# Patient Record
Sex: Male | Born: 1990 | Race: White | Hispanic: No | Marital: Married | Smoking: Former smoker
Health system: Southern US, Community
[De-identification: ages and names within clinical notes are randomized; demographics above are authoritative.]

## PROBLEM LIST (undated history)

## (undated) HISTORY — PX: LEG SURGERY: SHX1003

## (undated) HISTORY — PX: OTHER SURGICAL HISTORY: SHX169

## (undated) HISTORY — PX: NOSE SURGERY: SHX723

---

## 2012-08-26 ENCOUNTER — Ambulatory Visit (INDEPENDENT_AMBULATORY_CARE_PROVIDER_SITE_OTHER): Payer: BC Managed Care – PPO | Admitting: Physician Assistant

## 2012-08-26 VITALS — BP 112/82 | HR 64 | Temp 98.2°F | Resp 18 | Ht 72.5 in | Wt 267.0 lb

## 2012-08-26 DIAGNOSIS — J029 Acute pharyngitis, unspecified: Secondary | ICD-10-CM

## 2012-08-26 DIAGNOSIS — Z2089 Contact with and (suspected) exposure to other communicable diseases: Secondary | ICD-10-CM

## 2012-08-26 DIAGNOSIS — Z20818 Contact with and (suspected) exposure to other bacterial communicable diseases: Secondary | ICD-10-CM
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
## 2012-08-28 LAB — CULTURE, GROUP A STREP: Organism ID, Bacteria: NORMAL

## 2012-09-28 ENCOUNTER — Emergency Department (HOSPITAL_COMMUNITY)
Admission: EM | Admit: 2012-09-28 | Discharge: 2012-09-28 | Disposition: A | Discharge status: Home / Self Care | Payer: BC Managed Care – PPO | Attending: Emergency Medicine | Admitting: Emergency Medicine

## 2012-09-28 ENCOUNTER — Encounter (HOSPITAL_COMMUNITY): Payer: Self-pay | Admitting: Unknown Physician Specialty

## 2012-09-28 ENCOUNTER — Emergency Department (HOSPITAL_COMMUNITY): Payer: BC Managed Care – PPO

## 2012-09-28 DIAGNOSIS — R110 Nausea: Secondary | ICD-10-CM | POA: Insufficient documentation

## 2012-09-28 DIAGNOSIS — S71009A Unspecified open wound, unspecified hip, initial encounter: Secondary | ICD-10-CM | POA: Insufficient documentation

## 2012-09-28 DIAGNOSIS — R0789 Other chest pain: Secondary | ICD-10-CM | POA: Insufficient documentation

## 2012-09-28 DIAGNOSIS — Z23 Encounter for immunization: Secondary | ICD-10-CM | POA: Insufficient documentation

## 2012-09-28 DIAGNOSIS — F17200 Nicotine dependence, unspecified, uncomplicated: Secondary | ICD-10-CM | POA: Insufficient documentation

## 2012-09-28 DIAGNOSIS — Y998 Other external cause status: Secondary | ICD-10-CM | POA: Insufficient documentation

## 2012-09-28 DIAGNOSIS — T148XXA Other injury of unspecified body region, initial encounter: Secondary | ICD-10-CM

## 2012-09-28 DIAGNOSIS — Y929 Unspecified place or not applicable: Secondary | ICD-10-CM | POA: Insufficient documentation

## 2012-09-28 DIAGNOSIS — S71109A Unspecified open wound, unspecified thigh, initial encounter: Secondary | ICD-10-CM | POA: Insufficient documentation

## 2012-09-28 DIAGNOSIS — W39XXXA Discharge of firework, initial encounter: Secondary | ICD-10-CM | POA: Insufficient documentation

## 2012-09-28 MED ORDER — FENTANYL CITRATE 0.05 MG/ML IJ SOLN
50.0000 ug | Freq: Once | INTRAMUSCULAR | Status: AC
  Administered 2012-09-28: 50 ug via INTRAVENOUS
  Filled 2012-09-28: qty 2

## 2012-09-28 MED ORDER — HYDROCODONE-ACETAMINOPHEN 5-325 MG PO TABS: 1.0000 | ORAL_TABLET | Freq: Four times a day (QID) | ORAL | Status: DC | PRN

## 2012-09-28 MED ORDER — ACETAMINOPHEN 500 MG PO TABS
1000.0000 mg | ORAL_TABLET | Freq: Once | ORAL | Status: AC
  Administered 2012-09-28: 1000 mg via ORAL
  Filled 2012-09-28: qty 2

## 2012-09-28 MED ORDER — HYDROMORPHONE HCL PF 1 MG/ML IJ SOLN
1.0000 mg | Freq: Once | INTRAMUSCULAR | Status: AC
  Administered 2012-09-28: 1 mg via INTRAVENOUS
  Filled 2012-09-28: qty 1

## 2012-09-28 MED ORDER — FENTANYL CITRATE 0.05 MG/ML IJ SOLN
50.0000 ug | Freq: Once | INTRAMUSCULAR | Status: AC
  Administered 2012-09-28: 50 ug via INTRAVENOUS
  Filled 2012-09-28 (×2): qty 2

## 2012-09-28 MED ORDER — TETANUS-DIPHTHERIA TOXOIDS TD 5-2 LFU IM INJ
0.5000 mL | INJECTION | Freq: Once | INTRAMUSCULAR | Status: AC
  Administered 2012-09-28: 0.5 mL via INTRAMUSCULAR
  Filled 2012-09-28: qty 0.5

## 2012-09-28 MED ORDER — NALOXONE HCL 0.4 MG/ML IJ SOLN
INTRAMUSCULAR | Status: AC
  Filled 2012-09-28: qty 1
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
## 2012-09-29 ENCOUNTER — Telehealth (HOSPITAL_COMMUNITY): Payer: Self-pay | Admitting: Emergency Medicine

## 2012-09-29 NOTE — Telephone Encounter (Signed)
Left message

## 2012-09-30 ENCOUNTER — Telehealth (INDEPENDENT_AMBULATORY_CARE_PROVIDER_SITE_OTHER): Payer: Self-pay | Admitting: Orthopedic Surgery

## 2012-09-30 ENCOUNTER — Telehealth (HOSPITAL_COMMUNITY): Payer: Self-pay | Admitting: Emergency Medicine
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
## 2012-09-30 NOTE — Telephone Encounter (Signed)
Left message at both numbers
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
## 2022-07-18 ENCOUNTER — Ambulatory Visit: Payer: Self-pay | Admitting: Family Medicine

## 2022-07-18 NOTE — Progress Notes (Deleted)
  [facility] [address] [place] [address] Phone: [phone] Subjective:    I'm seeing this patient by the request  of:  Lesli Albee, MD  CC:   [id number]  Glenn Stanton is a 32 y.o. male coming in with complaint of ***  Xray L knee 07/17/2022 1.  No acute fracture.  2.  No malalignment.  3.  Small knee joint effusion.  4.  Remote healed proximal fibular fracture.  5.  Redemonstrated partially imaged left tibial intramedullary nail with similar fracture of the inferior proximal interlocking screw. No new complications along the imaged portions  6.  Joint spaces appear maintained.   Xray R foot 07/17/2022 1.  Best seen on the oblique view, there may be an acute nondisplaced fracture through the lateral aspect of the fifth metatarsal head. Correlate with tenderness in this location. Mild overlying soft tissue swelling.  2.  Otherwise, no acute fractures or traumatic malalignment.  3.  Mild first TMT and first MTP joint degenerative changes.  4.  Mild soft tissue swelling overlying the dorsal forefoot.     No past medical history on file. No past surgical history on file. Social History   Socioeconomic History   Marital status: Single    Spouse name: Not on file   Number of children: Not on file   Years of education: Not on file   Highest education level: Not on file  Occupational History   Not on file  Tobacco Use   Smoking status: Some Days   Smokeless tobacco: Not on file  Substance and Sexual Activity   Alcohol use: No   Drug use: No   Sexual activity: Not on file  Other Topics Concern   Not on file  Social History Narrative   Not on file   Social Determinants of Health   Financial Resource Strain: Not on file  Food Insecurity: Not on file  Transportation Needs: Not on file  Physical Activity: Not on file  Stress: Not on file  Social Connections: Not on file   No Known Allergies No family history on  file.     Current Outpatient Medications (Analgesics):    HYDROcodone-acetaminophen (NORCO/VICODIN) 5-325 MG per tablet, Take 1 tablet by mouth every 6 (six) hours as needed for pain.     Reviewed prior external information including notes and imaging from  primary care provider As well as notes that were available from care everywhere and other healthcare systems.  Past medical history, social, surgical and family history all reviewed in electronic medical record.  No pertanent information unless stated regarding to the chief complaint.   Review of Systems:  No headache, visual changes, nausea, vomiting, diarrhea, constipation, dizziness, abdominal pain, skin rash, fevers, chills, night sweats, weight loss, swollen lymph nodes, body aches, joint swelling, chest pain, shortness of breath, mood changes. POSITIVE muscle aches  Objective  There were no vitals taken for this visit.   General: No apparent distress alert and oriented x3 mood and affect normal, dressed appropriately.  HEENT: Pupils equal, extraocular movements intact  Respiratory: Patient's speak in full sentences and does not appear short of breath  Cardiovascular: No lower extremity edema, non tender, no erythema      Impression and Recommendations:

## 2023-11-07 ENCOUNTER — Ambulatory Visit
Admission: EM | Admit: 2023-11-07 | Discharge: 2023-11-07 | Disposition: A | Discharge status: Home / Self Care | Attending: Family Medicine | Admitting: Family Medicine

## 2023-11-07 DIAGNOSIS — L255 Unspecified contact dermatitis due to plants, except food: Secondary | ICD-10-CM

## 2023-11-07 MED ORDER — PREDNISONE 10 MG (21) PO TBPK
ORAL_TABLET | Freq: Every day | ORAL | 0 refills | Status: DC
Start: 2023-11-07 — End: 2023-12-04

## 2023-11-07 MED ORDER — TRIAMCINOLONE ACETONIDE 0.1 % EX CREA: 1.0000 | TOPICAL_CREAM | Freq: Two times a day (BID) | CUTANEOUS | 0 refills | Status: DC

## 2023-11-07 NOTE — ED Triage Notes (Signed)
 Pt present with poison ivy exposure. Pt has blisters on the rt forearm and rt leg.
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
## 2023-11-07 NOTE — Discharge Instructions (Addendum)
 Start prednisone  taper as prescribed.  You may use the topical steroid cream twice daily as needed to affected areas.  You may do over-the-counter allergy medicine such as Claritin or Zyrtec daily to help with itching.  Follow-up with your PCP if your symptoms do not improve.  Please go to the ER for any worsening symptoms.  Hope you feel better soon!

## 2023-12-04 ENCOUNTER — Ambulatory Visit
Admission: EM | Admit: 2023-12-04 | Discharge: 2023-12-04 | Disposition: A | Discharge status: Home / Self Care | Attending: Family Medicine | Admitting: Family Medicine

## 2023-12-04 ENCOUNTER — Other Ambulatory Visit: Payer: Self-pay

## 2023-12-04 DIAGNOSIS — L255 Unspecified contact dermatitis due to plants, except food: Secondary | ICD-10-CM | POA: Diagnosis not present

## 2023-12-04 MED ORDER — PREDNISONE 10 MG (21) PO TBPK: ORAL_TABLET | Freq: Every day | ORAL | 0 refills | Status: DC

## 2023-12-04 MED ORDER — DEXAMETHASONE SODIUM PHOSPHATE 10 MG/ML IJ SOLN
10.0000 mg | Freq: Once | INTRAMUSCULAR | Status: AC
  Administered 2023-12-04: 10 mg via INTRAMUSCULAR

## 2023-12-04 NOTE — ED Provider Notes (Signed)
 [facility]    CSN: [id number] Arrival date & time: 12/04/23  0946      History   Chief Complaint No chief complaint on file.   HPI Glenn Stanton is a 33 y.o. male presents for rash.  Patient reports 5 days of a pruritic vesicular rash on his right hand/arm, right knee, left calf.  He hunts and states he is constantly exposed to poison ivy or oak.  He was seen last month for the same thing that resolved after prednisone .  He states he had a couple doses left of the prednisone  which he started and states it cleared up the rash that was on his face.  No fevers or chills.  No history of MRSA.  No other concerns at this time.  HPI  History reviewed. No pertinent past medical history.  Patient Active Problem List   Diagnosis Date Noted   Blast injury 09/28/2012    Past Surgical History:  Procedure Laterality Date   arm surgery Left    LEG SURGERY Left    NOSE SURGERY         Home Medications    Prior to Admission medications   Medication Sig Start Date End Date Taking? Authorizing Provider  predniSONE  (STERAPRED UNI-PAK 21 TAB) 10 MG (21) TBPK tablet Take by mouth daily. Take 6 tabs by mouth daily  for 1 day, then 5 tabs for 1 day, then 4 tabs for 1 day, then 3 tabs for 1 day, 2 tabs for 1 day, then 1 tab by mouth daily for 1 days 12/05/23  Yes Eber Ferrufino, Jodi R, NP  HYDROcodone -acetaminophen  (NORCO/VICODIN) 5-325 MG per tablet Take 1 tablet by mouth every 6 (six) hours as needed for pain. 09/28/12   Lozier, Stephen, MD  triamcinolone  cream (KENALOG ) 0.1 % Apply 1 Application topically 2 (two) times daily. 11/07/23   Loreda Myla SAUNDERS, NP    Family History History reviewed. No pertinent family history.  Social History Social History   Tobacco Use   Smoking status: Former    Types: Cigarettes  Substance Use Topics   Alcohol use: Yes    Comment: occ   Drug use: No     Allergies   Oxycodone   Review of Systems Review of Systems  Skin:  Positive for rash.      Physical Exam Triage Vital Signs ED Triage Vitals  Encounter Vitals Group     BP 12/04/23 1008 (!) 142/95     Girls Systolic BP Percentile --      Girls Diastolic BP Percentile --      Boys Systolic BP Percentile --      Boys Diastolic BP Percentile --      Pulse Rate 12/04/23 1008 83     Resp 12/04/23 1008 17     Temp 12/04/23 1008 97.9 F (36.6 C)     Temp Source 12/04/23 1008 Oral     SpO2 12/04/23 1008 94 %     Weight --      Height --      Head Circumference --      Peak Flow --      Pain Score 12/04/23 1005 0     Pain Loc --      Pain Education --      Exclude from Growth Chart --    No data found.  Updated Vital Signs BP (!) 142/95   Pulse 83   Temp 97.9 F (36.6 C) (Oral)   Resp 17  SpO2 94%   Visual Acuity Right Eye Distance:   Left Eye Distance:   Bilateral Distance:    Right Eye Near:   Left Eye Near:    Bilateral Near:     Physical Exam Vitals and nursing note reviewed.  Constitutional:      General: He is not in acute distress.    Appearance: Normal appearance. He is not ill-appearing.  HENT:     Head: Normocephalic and atraumatic.  Eyes:     Pupils: Pupils are equal, round, and reactive to light.  Cardiovascular:     Rate and Rhythm: Normal rate.  Pulmonary:     Effort: Pulmonary effort is normal.  Skin:    General: Skin is warm and dry.     Comments: Scattered vesicular rash on right hand/forearm, left calf, right knee.  No rashes on face.  Neurological:     General: No focal deficit present.     Mental Status: He is alert and oriented to person, place, and time.  Psychiatric:        Mood and Affect: Mood normal.        Behavior: Behavior normal.      UC Treatments / Results  Labs (all labs ordered are listed, but only abnormal results are displayed) Labs Reviewed - No data to display  EKG   Radiology No results found.  Procedures Procedures (including critical care time)  Medications Ordered in UC Medications   dexamethasone  (DECADRON ) injection 10 mg (has no administration in time range)    Initial Impression / Assessment and Plan / UC Course  I have reviewed the triage vital signs and the nursing notes.  Pertinent labs & imaging results that were available during my care of the patient were reviewed by me and considered in my medical decision making (see chart for details).     Patient requested steroid injection, dexamethasone  IM given in clinic.  Start steroid taper tomorrow.  He already has triamcinolone  cream at home to use topically as needed.  PCP follow-up if symptoms do not improve.  ER precautions reviewed Final Clinical Impressions(s) / UC Diagnoses   Final diagnoses:  Rhus dermatitis     Discharge Instructions      You are given a steroid injection in the clinic.  Start the prednisone  taper tomorrow, 9/11.  You may continue the previously prescribed triamcinolone  topical steroid cream as needed to the areas for itching.  You may also take over-the-counter Zyrtec or Claritin daily.  Follow-up with your PCP if your symptoms do not improve.  ER precautions reviewed     ED Prescriptions     Medication Sig Dispense Auth. Provider   predniSONE  (STERAPRED UNI-PAK 21 TAB) 10 MG (21) TBPK tablet Take by mouth daily. Take 6 tabs by mouth daily  for 1 day, then 5 tabs for 1 day, then 4 tabs for 1 day, then 3 tabs for 1 day, 2 tabs for 1 day, then 1 tab by mouth daily for 1 days 21 tablet Arlean Thies, Jodi R, NP      PDMP not reviewed this encounter.   Loreda Myla SAUNDERS, NP 12/04/23 1030

## 2023-12-04 NOTE — ED Triage Notes (Signed)
 Pt c/o poison ivy on right hand, right arm, right knee, left calf, right upper eye lid and left earx5d. Pt states was in the woods hunting and that's how he got it

## 2023-12-04 NOTE — Discharge Instructions (Addendum)
 You are given a steroid injection in the clinic.  Start the prednisone  taper tomorrow, 9/11.  You may continue the previously prescribed triamcinolone  topical steroid cream as needed to the areas for itching.  You may also take over-the-counter Zyrtec or Claritin daily.  Follow-up with your PCP if your symptoms do not improve.  ER precautions reviewed

## 2024-03-06 ENCOUNTER — Other Ambulatory Visit: Payer: Self-pay

## 2024-03-06 ENCOUNTER — Ambulatory Visit
Admission: EM | Admit: 2024-03-06 | Discharge: 2024-03-06 | Disposition: A | Discharge status: Home / Self Care | Source: Home / Self Care

## 2024-03-06 DIAGNOSIS — J209 Acute bronchitis, unspecified: Secondary | ICD-10-CM | POA: Diagnosis not present

## 2024-03-06 MED ORDER — BENZONATATE 200 MG PO CAPS: 200.0000 mg | ORAL_CAPSULE | Freq: Three times a day (TID) | ORAL | 0 refills | Status: AC | PRN

## 2024-03-06 MED ORDER — AZITHROMYCIN 250 MG PO TABS: 250.0000 mg | ORAL_TABLET | Freq: Every day | ORAL | 0 refills | Status: AC

## 2024-03-06 MED ORDER — PREDNISONE 20 MG PO TABS: 40.0000 mg | ORAL_TABLET | Freq: Every day | ORAL | 0 refills | Status: AC

## 2024-03-06 NOTE — ED Triage Notes (Signed)
 Pt c/o losing voice, ore throat, productive cough w/green mucous, chest congestionx5d.

## 2024-03-06 NOTE — ED Provider Notes (Signed)
 [facility]    CSN: [id number] Arrival date & time: 03/06/24  9156      History   Chief Complaint Chief Complaint  Patient presents with   Cough   Sore Throat    HPI Glenn Stanton is a 33 y.o. male  presents for evaluation of URI symptoms for 5 days. Patient reports associated symptoms of cough, congestion, sore throat, laryngitis. Denies N/V/D, fevers, ear pain, body aches, shortness of breath. Patient does not have a hx of asthma. Patient is not an active smoker.   Reports no known sick contacts.  Pt has taken DayQuil NyQuil OTC for symptoms. Pt has no other concerns at this time.    Cough Associated symptoms: sore throat   Sore Throat    History reviewed. No pertinent past medical history.  Patient Active Problem List   Diagnosis Date Noted   Blast injury 09/28/2012    Past Surgical History:  Procedure Laterality Date   arm surgery Left    LEG SURGERY Left    NOSE SURGERY         Home Medications    Prior to Admission medications  Medication Sig Start Date End Date Taking? Authorizing Provider  azithromycin (ZITHROMAX) 250 MG tablet Take 1 tablet (250 mg total) by mouth daily. Take first 2 tablets together, then 1 every day until finished. 03/09/24  Yes Danity Schmelzer, Jodi R, NP  benzonatate (TESSALON) 200 MG capsule Take 1 capsule (200 mg total) by mouth 3 (three) times daily as needed. 03/06/24  Yes Kemaya Dorner, Jodi R, NP  predniSONE  (DELTASONE ) 20 MG tablet Take 2 tablets (40 mg total) by mouth daily with breakfast for 5 days. 03/06/24 03/11/24 Yes Loreda Myla SAUNDERS, NP    Family History History reviewed. No pertinent family history.  Social History Social History[1]   Allergies   Oxycodone   Review of Systems Review of Systems  HENT:  Positive for congestion and sore throat.   Respiratory:  Positive for cough.      Physical Exam Triage Vital Signs ED Triage Vitals  Encounter Vitals Group     BP 03/06/24 0855 (!) 148/93     Girls Systolic BP  Percentile --      Girls Diastolic BP Percentile --      Boys Systolic BP Percentile --      Boys Diastolic BP Percentile --      Pulse Rate 03/06/24 0855 85     Resp 03/06/24 0855 20     Temp 03/06/24 0855 97.8 F (36.6 C)     Temp Source 03/06/24 0855 Oral     SpO2 03/06/24 0855 98 %     Weight --      Height --      Head Circumference --      Peak Flow --      Pain Score 03/06/24 0853 3     Pain Loc --      Pain Education --      Exclude from Growth Chart --    No data found.  Updated Vital Signs BP (!) 148/93   Pulse 85   Temp 97.8 F (36.6 C) (Oral)   Resp 20   SpO2 98%   Visual Acuity Right Eye Distance:   Left Eye Distance:   Bilateral Distance:    Right Eye Near:   Left Eye Near:    Bilateral Near:     Physical Exam Vitals and nursing note reviewed.  Constitutional:  General: He is not in acute distress.    Appearance: Normal appearance. He is not ill-appearing or toxic-appearing.  HENT:     Head: Normocephalic and atraumatic.     Right Ear: Tympanic membrane and ear canal normal.     Left Ear: Tympanic membrane and ear canal normal.     Nose: Congestion present.     Mouth/Throat:     Mouth: Mucous membranes are moist.     Pharynx: Posterior oropharyngeal erythema present.  Eyes:     Pupils: Pupils are equal, round, and reactive to light.  Cardiovascular:     Rate and Rhythm: Normal rate and regular rhythm.     Heart sounds: Normal heart sounds.  Pulmonary:     Effort: Pulmonary effort is normal.     Breath sounds: Normal breath sounds. No wheezing, rhonchi or rales.  Musculoskeletal:     Cervical back: Normal range of motion and neck supple.  Lymphadenopathy:     Cervical: No cervical adenopathy.  Skin:    General: Skin is warm and dry.  Neurological:     General: No focal deficit present.     Mental Status: He is alert and oriented to person, place, and time.  Psychiatric:        Mood and Affect: Mood normal.        Behavior:  Behavior normal.      UC Treatments / Results  Labs (all labs ordered are listed, but only abnormal results are displayed) Labs Reviewed - No data to display  EKG   Radiology No results found.  Procedures Procedures (including critical care time)  Medications Ordered in UC Medications - No data to display  Initial Impression / Assessment and Plan / UC Course  I have reviewed the triage vital signs and the nursing notes.  Pertinent labs & imaging results that were available during my care of the patient were reviewed by me and considered in my medical decision making (see chart for details).     Reviewed exam and symptoms with patient.  No red flags.  He declined COVID testing.  Discussed viral bronchitis and symptomatic treatment.  Will do Tessalon and prednisone .  Provisional prescription for azithromycin provided with instruction not to take unless symptoms do not improve or worsen over the next 3 days and patient verbalized understanding.  PCP follow-up if symptoms do not improve.  ER precautions reviewed. Final Clinical Impressions(s) / UC Diagnoses   Final diagnoses:  Acute bronchitis, unspecified organism     Discharge Instructions      Start Tessalon as needed for your cough 3 times a day.  You may take prednisone  daily for 5 days.  Provisional prescription for an antibiotic azithromycin has been provided.  Please do not take unless your symptoms do not improve or worsen over the next 3 days.  Lots of rest and fluids.  Please follow-up with your PCP if your symptoms do not improve.  Please go to the emergency room for any worsening symptoms.  I hope you feel better soon!    ED Prescriptions     Medication Sig Dispense Auth. Provider   benzonatate (TESSALON) 200 MG capsule Take 1 capsule (200 mg total) by mouth 3 (three) times daily as needed. 20 capsule Jarrad Mclees, Jodi R, NP   azithromycin (ZITHROMAX) 250 MG tablet Take 1 tablet (250 mg total) by mouth daily. Take  first 2 tablets together, then 1 every day until finished. 6 tablet Valente Fosberg, Jodi R, NP   predniSONE  (  DELTASONE ) 20 MG tablet Take 2 tablets (40 mg total) by mouth daily with breakfast for 5 days. 10 tablet Alvena Kiernan, Jodi R, NP      PDMP not reviewed this encounter.    [1]  Social History Tobacco Use   Smoking status: Former    Types: Cigarettes  Vaping Use   Vaping status: Never Used  Substance Use Topics   Alcohol use: Yes    Comment: occ   Drug use: No     Loreda Myla SAUNDERS, NP 03/06/24 [phone]

## 2024-03-06 NOTE — Discharge Instructions (Addendum)
 Start Tessalon as needed for your cough 3 times a day.  You may take prednisone  daily for 5 days.  Provisional prescription for an antibiotic azithromycin has been provided.  Please do not take unless your symptoms do not improve or worsen over the next 3 days.  Lots of rest and fluids.  Please follow-up with your PCP if your symptoms do not improve.  Please go to the emergency room for any worsening symptoms.  I hope you feel better soon!
